# Patient Record
Sex: Male | Born: 2000 | Race: White | Hispanic: No | Marital: Single | Smoking: Never smoker
Health system: Southern US, Community
[De-identification: ages and names within clinical notes are randomized; demographics above are authoritative.]

## PROBLEM LIST (undated history)

## (undated) DIAGNOSIS — Z9109 Other allergy status, other than to drugs and biological substances: Secondary | ICD-10-CM

---
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
## 2020-02-23 DIAGNOSIS — Z419 Encounter for procedure for purposes other than remedying health state, unspecified: Secondary | ICD-10-CM | POA: Diagnosis not present

## 2020-03-25 DIAGNOSIS — Z419 Encounter for procedure for purposes other than remedying health state, unspecified: Secondary | ICD-10-CM | POA: Diagnosis not present

## 2020-04-25 DIAGNOSIS — Z419 Encounter for procedure for purposes other than remedying health state, unspecified: Secondary | ICD-10-CM | POA: Diagnosis not present

## 2020-05-25 DIAGNOSIS — Z419 Encounter for procedure for purposes other than remedying health state, unspecified: Secondary | ICD-10-CM | POA: Diagnosis not present

## 2020-06-25 DIAGNOSIS — Z419 Encounter for procedure for purposes other than remedying health state, unspecified: Secondary | ICD-10-CM | POA: Diagnosis not present

## 2020-07-25 DIAGNOSIS — Z419 Encounter for procedure for purposes other than remedying health state, unspecified: Secondary | ICD-10-CM | POA: Diagnosis not present

## 2020-08-13 ENCOUNTER — Ambulatory Visit: Payer: Medicaid Other | Admitting: Family Medicine

## 2020-08-25 DIAGNOSIS — Z419 Encounter for procedure for purposes other than remedying health state, unspecified: Secondary | ICD-10-CM | POA: Diagnosis not present

## 2020-09-25 DIAGNOSIS — Z419 Encounter for procedure for purposes other than remedying health state, unspecified: Secondary | ICD-10-CM | POA: Diagnosis not present

## 2020-10-23 DIAGNOSIS — Z419 Encounter for procedure for purposes other than remedying health state, unspecified: Secondary | ICD-10-CM | POA: Diagnosis not present

## 2020-11-23 DIAGNOSIS — Z419 Encounter for procedure for purposes other than remedying health state, unspecified: Secondary | ICD-10-CM | POA: Diagnosis not present

## 2020-12-23 DIAGNOSIS — Z419 Encounter for procedure for purposes other than remedying health state, unspecified: Secondary | ICD-10-CM | POA: Diagnosis not present

## 2021-01-23 DIAGNOSIS — Z419 Encounter for procedure for purposes other than remedying health state, unspecified: Secondary | ICD-10-CM | POA: Diagnosis not present

## 2021-02-22 DIAGNOSIS — Z419 Encounter for procedure for purposes other than remedying health state, unspecified: Secondary | ICD-10-CM | POA: Diagnosis not present

## 2021-09-23 ENCOUNTER — Ambulatory Visit: Payer: Medicaid Other | Admitting: Family Medicine

## 2021-09-23 ENCOUNTER — Encounter: Payer: Self-pay | Admitting: Family Medicine

## 2021-09-23 ENCOUNTER — Other Ambulatory Visit: Payer: Self-pay

## 2021-09-23 VITALS — BP 138/88 | HR 81 | Temp 98.7°F | Ht 68.5 in | Wt 154.0 lb

## 2021-09-23 DIAGNOSIS — N6310 Unspecified lump in the right breast, unspecified quadrant: Secondary | ICD-10-CM | POA: Insufficient documentation

## 2021-09-23 NOTE — Assessment & Plan Note (Signed)
Mammogram and ultrasound for further evaluation.

## 2021-09-23 NOTE — Progress Notes (Signed)
° °  Subjective:  Patient ID: GLYN GERADS, male    DOB: Nov 01, 2000  Age: 21 y.o. MRN: [id number]  CC: Chief Complaint  Patient presents with   knott under right nipple     X 2 months    HPI:  21 year old male presents for evaluation of the above.  Patient reports that for the past 2 months he has noticed and palpated a firm area behind his right nipple.  He states that it slightly tender when pressure is applied.  He thought that this would resolve without intervention however it has continued to persist.  Denies any other lumps or masses.  No fevers, chills, night sweats.  He is feeling well otherwise.  However, he is quite concerned.  Patient Active Problem List   Diagnosis Date Noted   Mass of right breast 09/23/2021   Generalized anxiety disorder with panic attacks 02/26/2019    Social Hx   Social History   Socioeconomic History   Marital status: Single    Spouse name: Not on file   Number of children: Not on file   Years of education: Not on file   Highest education level: Not on file  Occupational History   Not on file  Tobacco Use   Smoking status: Never   Smokeless tobacco: Never  Substance and Sexual Activity   Alcohol use: No   Drug use: No   Sexual activity: Never  Other Topics Concern   Not on file  Social History Narrative   Not on file   Social Determinants of Health   Financial Resource Strain: Not on file  Food Insecurity: Not on file  Transportation Needs: Not on file  Physical Activity: Not on file  Stress: Not on file  Social Connections: Not on file    Review of Systems Per HPI  Objective:  BP 138/88    Pulse 81    Temp 98.7 F (37.1 C)    Ht 5' 8.5" (1.74 m)    Wt 154 lb (69.9 kg)    SpO2 99%    BMI 23.08 kg/m   BP/Weight 09/23/2021 10/13/2017 06/23/2017  Systolic BP 138 128 128  Diastolic BP 88 68 62  Wt. (Lbs) 154 152 141  BMI 23.08 22.78 21.28    Physical Exam Constitutional:      General: He is not in acute distress.     Appearance: Normal appearance. He is not ill-appearing.  Cardiovascular:     Rate and Rhythm: Normal rate and regular rhythm.  Pulmonary:     Effort: Pulmonary effort is normal.     Breath sounds: Normal breath sounds. No wheezing, rhonchi or rales.  Chest:     Comments: Palpable firm nodule directly behind the right nipple.  No appreciable axillary lymphadenopathy.  No supraclavicular lymphadenopathy. Neurological:     Mental Status: He is alert.  Psychiatric:        Mood and Affect: Mood normal.        Behavior: Behavior normal.     Assessment & Plan:   Problem List Items Addressed This Visit       Other   Mass of right breast - Primary    Mammogram and ultrasound for further evaluation.      Relevant Orders   US BREAST COMPLETE UNI RIGHT INC AXILLA    Edith Lord DO [facility]

## 2021-09-23 NOTE — Patient Instructions (Signed)
You will receive a call about scheduling.  Call with concerns.  Take care  Dr. Adriana Simas

## 2021-10-01 ENCOUNTER — Other Ambulatory Visit: Payer: Self-pay

## 2021-10-01 ENCOUNTER — Ambulatory Visit (HOSPITAL_COMMUNITY)
Admission: RE | Admit: 2021-10-01 | Discharge: 2021-10-01 | Disposition: A | Discharge status: Home / Self Care | Payer: BC Managed Care – PPO | Source: Ambulatory Visit | Attending: Family Medicine | Admitting: Family Medicine

## 2021-10-01 DIAGNOSIS — N6310 Unspecified lump in the right breast, unspecified quadrant: Secondary | ICD-10-CM | POA: Insufficient documentation

## 2021-10-01 DIAGNOSIS — N62 Hypertrophy of breast: Secondary | ICD-10-CM | POA: Diagnosis not present

## 2022-05-22 ENCOUNTER — Ambulatory Visit (INDEPENDENT_AMBULATORY_CARE_PROVIDER_SITE_OTHER): Payer: 59 | Admitting: Family Medicine

## 2022-05-22 ENCOUNTER — Encounter: Payer: Self-pay | Admitting: Family Medicine

## 2022-05-22 VITALS — BP 124/72 | HR 74 | Temp 97.5°F | Wt 159.4 lb

## 2022-05-22 DIAGNOSIS — J988 Other specified respiratory disorders: Secondary | ICD-10-CM

## 2022-05-22 DIAGNOSIS — B9789 Other viral agents as the cause of diseases classified elsewhere: Secondary | ICD-10-CM

## 2022-05-22 DIAGNOSIS — J029 Acute pharyngitis, unspecified: Secondary | ICD-10-CM | POA: Diagnosis not present

## 2022-05-22 LAB — POCT RAPID STREP A (OFFICE): Rapid Strep A Screen: NEGATIVE

## 2022-05-22 MED ORDER — IPRATROPIUM BROMIDE 0.06 % NA SOLN: 2.0000 | Freq: Four times a day (QID) | NASAL | 0 refills | Status: AC | PRN

## 2022-05-22 NOTE — Progress Notes (Signed)
Subjective:  Patient ID: Andres Brewer, male    DOB: 2001-07-14  Age: 21 y.o. MRN: [id number]  CC: Chief Complaint  Patient presents with   Sore Throat    Pt arrives with sore throat and runny nose. Work sent him home due to thinking it was COVID. Symptoms began on 05/20/22    HPI:  21 year old male presents for evaluation of the above.  Patient states that he has not been feeling well since 9/26.  He reports sore throat and runny nose.  He has had a recent sick contact in his girlfriend's sister.  No fever.  No COVID testing has been done at this time.  No relieving factors.  He was sent home from work today and thus presents for evaluation.  No other associated symptoms.  No other complaints or concerns at this time.  Patient Active Problem List   Diagnosis Date Noted   Viral respiratory infection 05/22/2022   Generalized anxiety disorder with panic attacks 02/26/2019    Social Hx   Social History   Socioeconomic History   Marital status: Single    Spouse name: Not on file   Number of children: Not on file   Years of education: Not on file   Highest education level: Not on file  Occupational History   Not on file  Tobacco Use   Smoking status: Never   Smokeless tobacco: Never  Substance and Sexual Activity   Alcohol use: No   Drug use: No   Sexual activity: Never  Other Topics Concern   Not on file  Social History Narrative   Not on file   Social Determinants of Health   Financial Resource Strain: Not on file  Food Insecurity: Not on file  Transportation Needs: Not on file  Physical Activity: Not on file  Stress: Not on file  Social Connections: Not on file    Review of Systems Per HPI  Objective:  BP 124/72   Pulse 74   Temp (!) 97.5 F (36.4 C)   Wt 159 lb 6.4 oz (72.3 kg)   SpO2 96%   BMI 23.88 kg/m      05/22/2022    3:24 PM 09/23/2021    3:16 PM 10/13/2017    1:06 PM  BP/Weight  Systolic BP 481 856 314  Diastolic BP 72 88 68  Wt.  (Lbs) 159.4 154 152  BMI 23.88 kg/m2 23.08 kg/m2 22.78 kg/m2    Physical Exam Vitals and nursing note reviewed.  Constitutional:      General: He is not in acute distress.    Appearance: Normal appearance.  HENT:     Head: Normocephalic and atraumatic.     Right Ear: Tympanic membrane normal.     Left Ear: Tympanic membrane normal.     Mouth/Throat:     Pharynx: Posterior oropharyngeal erythema present. No oropharyngeal exudate.  Eyes:     General:        Right eye: No discharge.        Left eye: No discharge.     Conjunctiva/sclera: Conjunctivae normal.  Cardiovascular:     Rate and Rhythm: Normal rate and regular rhythm.  Pulmonary:     Effort: Pulmonary effort is normal.     Breath sounds: Normal breath sounds. No wheezing, rhonchi or rales.  Neurological:     Mental Status: He is alert.  Psychiatric:        Mood and Affect: Mood normal.  Behavior: Behavior normal.    Assessment & Plan:   Problem List Items Addressed This Visit       Respiratory   Viral respiratory infection - Primary    Rapid strep negative.  Concern for possible COVID-19.  Awaiting PCR test results.  Atrovent nasal spray for rhinorrhea and congestion.  Advised supportive care.  Work note given.      Other Visit Diagnoses     Sore throat       Relevant Orders   POCT rapid strep A (Completed)   Novel Coronavirus, NAA ([facility])       Meds ordered this encounter  Medications   ipratropium (ATROVENT) 0.06 % nasal spray    Sig: Place 2 sprays into both nostrils 4 (four) times daily as needed for rhinitis.    Dispense:  15 mL    Refill:  0    Follow-up:  PRN  [place]

## 2022-05-22 NOTE — Assessment & Plan Note (Signed)
Rapid strep negative.  Concern for possible COVID-19.  Awaiting PCR test results.  Atrovent nasal spray for rhinorrhea and congestion.  Advised supportive care.  Work note given.

## 2022-05-22 NOTE — Patient Instructions (Signed)
Strep negative.  Rest, fluids.  Medication as prescribed.  Please stay home.  We will call with the results of the COVID test.  Ibuprofen as needed for the sore throat.  Take care  Dr. Lacinda Axon

## 2022-05-23 LAB — NOVEL CORONAVIRUS, NAA: SARS-CoV-2, NAA: NOT DETECTED

## 2023-01-05 ENCOUNTER — Telehealth: Payer: Self-pay

## 2023-01-05 NOTE — Transitions of Care (Post Inpatient/ED Visit) (Signed)
   01/05/2023  Name: Andres Brewer MRN: [id number] DOB: 11/13/2000  Today's TOC FU Call Status: Today's TOC FU Call Status:: Successful TOC FU Call Competed TOC FU Call Complete Date: 01/05/23  Transition Care Management Follow-up Telephone Call Date of Discharge: 01/03/23 Discharge Facility: Other ([facility]) Name of Other ([facility]) Discharge Facility: [facility] Type of Discharge: Emergency Department Reason for ED Visit: Other: (fractured right toe) How have you been since you were released from the hospital?: Same Any questions or concerns?: No  Items Reviewed: Did you receive and understand the discharge instructions provided?: Yes Medications obtained,verified, and reconciled?: Yes (Medications Reviewed) Any new allergies since your discharge?: No Dietary orders reviewed?: Yes Do you have support at home?: Yes People in Home: parent(s)  Medications Reviewed Today: Medications Reviewed Today     Reviewed by Karena Addison, LPN (Licensed Practical Nurse) on 01/05/23 at 1543  Med List Status: <None>   Medication Order Taking? Sig Documenting Provider Last Dose Status Informant  ipratropium (ATROVENT) 0.06 % nasal spray [id number] Yes Place 2 sprays into both nostrils 4 (four) times daily as needed for rhinitis. Tommie Sams, DO Taking Active             Home Care and Equipment/Supplies: Were Home Health Services Ordered?: NA Any new equipment or medical supplies ordered?: NA  Functional Questionnaire: Do you need assistance with bathing/showering or dressing?: No Do you need assistance with meal preparation?: No Do you need assistance with eating?: No Do you have difficulty maintaining continence: No Do you need assistance with getting out of bed/getting out of a chair/moving?: No Do you have difficulty managing or taking your medications?: No  Follow up appointments reviewed: PCP Follow-up appointment confirmed?: NA Specialist Hospital Follow-up  appointment confirmed?: Yes Date of Specialist follow-up appointment?: 01/09/23 Follow-Up Specialty Provider:: ortho Do you need transportation to your follow-up appointment?: No Do you understand care options if your condition(s) worsen?: Yes-patient verbalized understanding    SIGNATURE Karena Addison, LPN [facility] Nurse Health Advisor Direct Dial [phone]

## 2023-11-11 IMAGING — US US BREAST*R* LIMITED INC AXILLA
1 series · 2 of 2 positions shown · non-contrast
Comparison: None.

CLINICAL DATA: 20-year-old male with a right retroareolar palpable
area of concern.

EXAM:
ULTRASOUND OF THE RIGHT BREAST

[Series 1: us breast*right* limited inc axilla · 0.07mm/px · 2 of 2 slices shown]
[im 1/2]
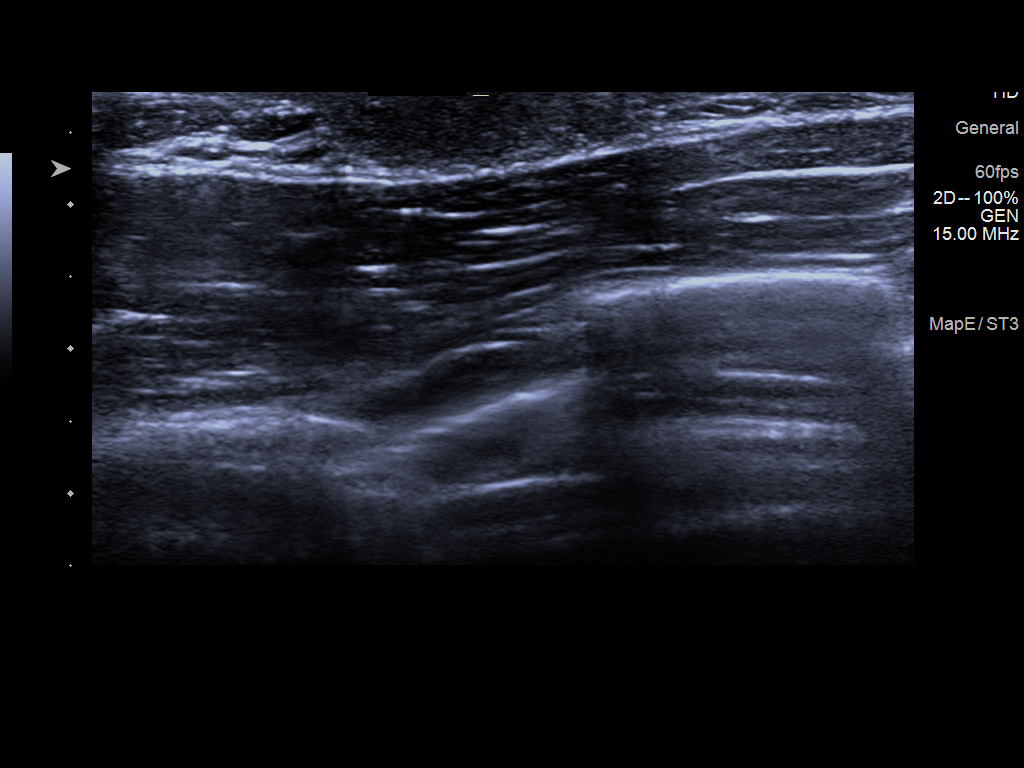
[im 2/2]
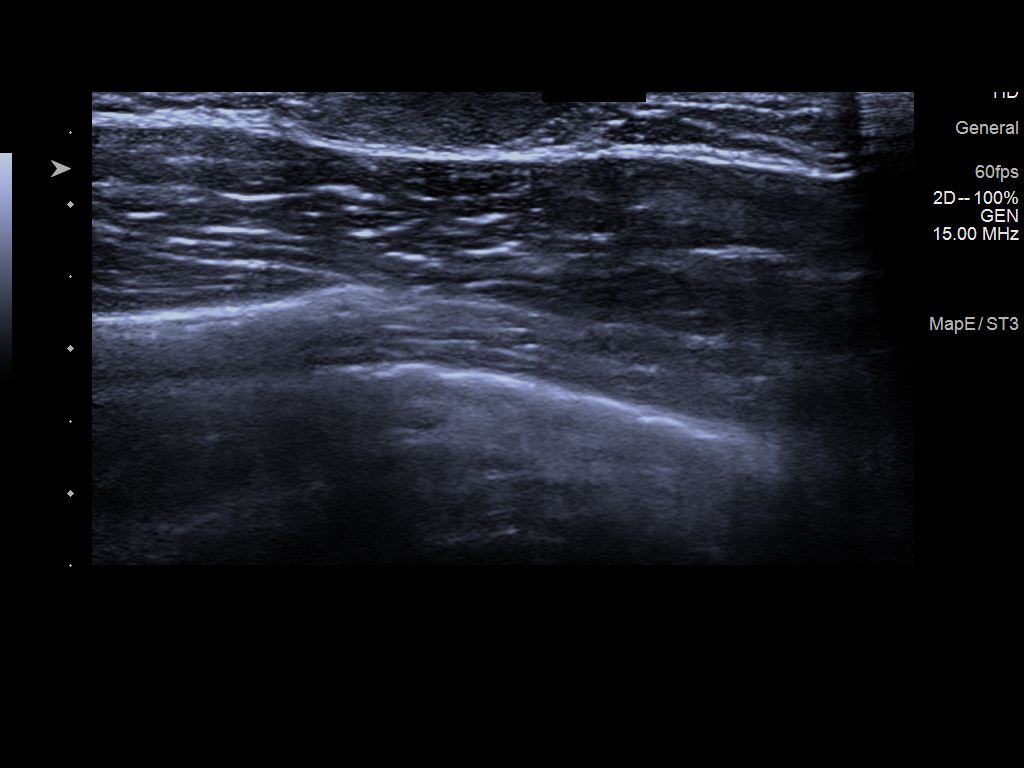

[2 of 2 positions shown; findings below may reference images not displayed]

FINDINGS: Physical examination reveals quarter-sized mobile soft tissue
directly behind the nipple.

Targeted ultrasound of the retroareolar right breast was performed
demonstrating a small oval hypoechoic mass/area subjacent to the
nipple with findings most compatible with gynecomastia (minimal).
IMPRESSION: Gynecomastia, accounting for patient's right retroareolar palpable
area of concern.

RECOMMENDATION:
Clinical follow-up.

I have discussed the findings and recommendations with the patient.
If applicable, a reminder letter will be sent to the patient
regarding the next appointment.

BI-RADS CATEGORY  2: Benign.
